# Patient Record
Sex: Male | Born: 1995 | Race: Black or African American | Hispanic: No | Marital: Single | Smoking: Never smoker
Health system: Southern US, Community
[De-identification: ages and names within clinical notes are randomized; demographics above are authoritative.]

## PROBLEM LIST (undated history)

## (undated) HISTORY — PX: TONSILLECTOMY: SUR1361

---

## 2015-11-15 ENCOUNTER — Encounter (HOSPITAL_COMMUNITY): Payer: Self-pay | Admitting: Emergency Medicine

## 2015-11-15 ENCOUNTER — Emergency Department (HOSPITAL_COMMUNITY): Payer: Managed Care, Other (non HMO)

## 2015-11-15 ENCOUNTER — Emergency Department (HOSPITAL_COMMUNITY)
Admission: EM | Admit: 2015-11-15 | Discharge: 2015-11-15 | Disposition: A | Discharge status: Home / Self Care | Payer: Managed Care, Other (non HMO) | Attending: Physician Assistant | Admitting: Physician Assistant

## 2015-11-15 DIAGNOSIS — R1013 Epigastric pain: Secondary | ICD-10-CM | POA: Diagnosis present

## 2015-11-15 DIAGNOSIS — Z880 Allergy status to penicillin: Secondary | ICD-10-CM | POA: Insufficient documentation

## 2015-11-15 DIAGNOSIS — N390 Urinary tract infection, site not specified: Secondary | ICD-10-CM | POA: Insufficient documentation

## 2015-11-15 DIAGNOSIS — R945 Abnormal results of liver function studies: Secondary | ICD-10-CM | POA: Insufficient documentation

## 2015-11-15 DIAGNOSIS — R748 Abnormal levels of other serum enzymes: Secondary | ICD-10-CM

## 2015-11-15 DIAGNOSIS — Z79899 Other long term (current) drug therapy: Secondary | ICD-10-CM | POA: Insufficient documentation

## 2015-11-15 LAB — URINALYSIS, ROUTINE W REFLEX MICROSCOPIC
BILIRUBIN URINE: NEGATIVE
GLUCOSE, UA: NEGATIVE mg/dL
KETONES UR: NEGATIVE mg/dL
Nitrite: NEGATIVE
PH: 7 (ref 5.0–8.0)
Protein, ur: NEGATIVE mg/dL
Specific Gravity, Urine: 1.009 (ref 1.005–1.030)

## 2015-11-15 LAB — CBC WITH DIFFERENTIAL/PLATELET
BASOS ABS: 0 10*3/uL (ref 0.0–0.1)
BASOS PCT: 0 %
Eosinophils Absolute: 0.1 10*3/uL (ref 0.0–0.7)
Eosinophils Relative: 0 %
HEMATOCRIT: 42.9 % (ref 39.0–52.0)
HEMOGLOBIN: 14.5 g/dL (ref 13.0–17.0)
LYMPHS PCT: 8 %
Lymphs Abs: 1.3 10*3/uL (ref 0.7–4.0)
MCH: 28.9 pg (ref 26.0–34.0)
MCHC: 33.8 g/dL (ref 30.0–36.0)
MCV: 85.5 fL (ref 78.0–100.0)
MONO ABS: 1.1 10*3/uL — AB (ref 0.1–1.0)
MONOS PCT: 7 %
NEUTROS ABS: 14.2 10*3/uL — AB (ref 1.7–7.7)
NEUTROS PCT: 85 %
Platelets: 259 10*3/uL (ref 150–400)
RBC: 5.02 MIL/uL (ref 4.22–5.81)
RDW: 13.3 % (ref 11.5–15.5)
WBC: 16.7 10*3/uL — ABNORMAL HIGH (ref 4.0–10.5)

## 2015-11-15 LAB — URINE MICROSCOPIC-ADD ON

## 2015-11-15 LAB — COMPREHENSIVE METABOLIC PANEL
ALBUMIN: 4.3 g/dL (ref 3.5–5.0)
ALK PHOS: 61 U/L (ref 38–126)
ALT: 217 U/L — ABNORMAL HIGH (ref 17–63)
AST: 206 U/L — AB (ref 15–41)
Anion gap: 9 (ref 5–15)
BILIRUBIN TOTAL: 1.5 mg/dL — AB (ref 0.3–1.2)
BUN: 12 mg/dL (ref 6–20)
CALCIUM: 10.1 mg/dL (ref 8.9–10.3)
CO2: 26 mmol/L (ref 22–32)
CREATININE: 0.95 mg/dL (ref 0.61–1.24)
Chloride: 102 mmol/L (ref 101–111)
GFR calc Af Amer: >60 mL/min (ref >60)
GLUCOSE: 97 mg/dL (ref 65–99)
Potassium: 4.3 mmol/L (ref 3.5–5.1)
Sodium: 137 mmol/L (ref 135–145)
TOTAL PROTEIN: 7.9 g/dL (ref 6.5–8.1)

## 2015-11-15 LAB — LIPASE, BLOOD: LIPASE: 24 U/L (ref 11–51)

## 2015-11-15 MED ORDER — SODIUM CHLORIDE 0.9 % IV BOLUS (SEPSIS)
500.0000 mL | Freq: Once | INTRAVENOUS | Status: AC
  Administered 2015-11-15: 500 mL via INTRAVENOUS

## 2015-11-15 MED ORDER — GI COCKTAIL ~~LOC~~
30.0000 mL | Freq: Once | ORAL | Status: AC
  Administered 2015-11-15: 30 mL via ORAL
  Filled 2015-11-15: qty 30

## 2015-11-15 MED ORDER — CEPHALEXIN 500 MG PO CAPS: 500.0000 mg | ORAL_CAPSULE | Freq: Two times a day (BID) | ORAL | Status: AC

## 2015-11-15 MED ORDER — NITROFURANTOIN MONOHYD MACRO 100 MG PO CAPS: 100.0000 mg | ORAL_CAPSULE | Freq: Two times a day (BID) | ORAL | Status: DC

## 2015-11-15 NOTE — ED Notes (Signed)
He is awake, alert and in no distress.  He has a male visitor with him.

## 2015-11-15 NOTE — ED Provider Notes (Signed)
CSN: [id number]     Arrival date & time 11/15/15  0453 History   First MD Initiated Contact with Patient 11/15/15 [phone]     Chief Complaint  Patient presents with  . Abdominal Pain     (Consider location/radiation/quality/duration/timing/severity/associated sxs/prior Treatment) HPI   Patient is a 20 year old male with no pertinent past medical history who presents to the ED with complaint of epigastric pain, onset yesterday afternoon. Patient reports after eating pizza and wings yesterday at lunch she began having mild discomfort to his epigastric region. She notes the pain started to improve and then worsened around 8 PM. Patient reports having constant sharp/cramping pain to his epigastric region, denies radiation. He notes the pain is worse when laying supine and is mildly alleviated when sitting upright. Patient reports taking Tums and ibuprofen at home with mild intermittent relief. Denies fever, chills, cough, chest pain, shortness of breath, nausea, vomiting, diarrhea, urinary symptoms.   History reviewed. No pertinent past medical history. Past Surgical History  Procedure Laterality Date  . Tonsillectomy     No family history on file. Social History  Substance Use Topics  . Smoking status: Never Smoker   . Smokeless tobacco: None  . Alcohol Use: No    Review of Systems  Gastrointestinal: Positive for abdominal pain.  All other systems reviewed and are negative.     Allergies  Penicillins  Home Medications   Prior to Admission medications   Medication Sig Start Date End Date Taking? Authorizing Provider  cephALEXin (KEFLEX) 500 MG capsule Take 1 capsule (500 mg total) by mouth 2 (two) times daily. 11/15/15   Barrett HenleNicole Elizabeth Quavon Keisling, PA-C  Multiple Vitamin (MULTIVITAMIN WITH MINERALS) TABS tablet Take 1 tablet by mouth daily.   Yes Historical Provider, MD  vitamin E 400 UNIT capsule Take 400 Units by mouth daily.   Yes Historical Provider, MD   BP 134/80 mmHg  Pulse  71  Temp(Src) 98.2 F (36.8 C) (Oral)  Resp 15  Ht 6' (1.829 m)  Wt 79.379 kg  BMI 23.73 kg/m2  SpO2 100% Physical Exam  Constitutional: He is oriented to person, place, and time. He appears well-developed and well-nourished. No distress.  HENT:  Head: Normocephalic and atraumatic.  Mouth/Throat: Oropharynx is clear and moist. No oropharyngeal exudate.  Eyes: Conjunctivae and EOM are normal. Right eye exhibits no discharge. Left eye exhibits no discharge. No scleral icterus.  Neck: Normal range of motion. Neck supple.  Cardiovascular: Normal rate, regular rhythm, normal heart sounds and intact distal pulses.   Pulmonary/Chest: Effort normal and breath sounds normal. No respiratory distress. He has no wheezes. He has no rales. He exhibits no tenderness.  Abdominal: Soft. Bowel sounds are normal. He exhibits no distension and no mass. There is tenderness (mild tenderness in epigastric region). There is no rebound and no guarding.  Musculoskeletal: Normal range of motion. He exhibits no edema.  Neurological: He is alert and oriented to person, place, and time.  Skin: Skin is warm and dry. He is not diaphoretic.  Nursing note and vitals reviewed.   ED Course  Procedures (including critical care time) Labs Review Labs Reviewed  CBC WITH DIFFERENTIAL/PLATELET - Abnormal; Notable for the following:    WBC 16.7 (*)    Neutro Abs 14.2 (*)    Monocytes Absolute 1.1 (*)    All other components within normal limits  COMPREHENSIVE METABOLIC PANEL - Abnormal; Notable for the following:    AST 206 (*)    ALT 217 (*)  Total Bilirubin 1.5 (*)    All other components within normal limits  URINALYSIS, ROUTINE W REFLEX MICROSCOPIC (NOT AT [facility]) - Abnormal; Notable for the following:    APPearance CLOUDY (*)    Hgb urine dipstick TRACE (*)    Leukocytes, UA SMALL (*)    All other components within normal limits  URINE MICROSCOPIC-ADD ON - Abnormal; Notable for the following:    Squamous  Epithelial / LPF 0-5 (*)    Bacteria, UA FEW (*)    All other components within normal limits  URINE CULTURE  LIPASE, BLOOD    Imaging Review US Abdomen Complete  11/15/2015  CLINICAL DATA:  Elevated liver enzymes with epigastric region pain EXAM: ABDOMEN ULTRASOUND COMPLETE COMPARISON:  None. FINDINGS: Gallbladder: No gallstones or wall thickening visualized. There is no pericholecystic fluid. No sonographic Murphy sign noted by sonographer. Common bile duct: Diameter: 3 mm. There is no intrahepatic, common hepatic, common bile duct dilatation. Liver: No focal lesion identified. Within normal limits in parenchymal echogenicity. IVC: No abnormality visualized. Pancreas: No mass or inflammatory focus. Spleen: Size and appearance within normal limits. Right Kidney: Length: 10.4 cm. Echogenicity slightly increased. Renal cortical thickness normal. No mass or hydronephrosis visualized. There is trace pericholecystic fluid in the lower pole region. Left Kidney: Length: 11.1 cm. Echogenicity within normal limits. No mass or hydronephrosis visualized. Abdominal aorta: No aneurysm visualized. Other findings: No demonstrable ascites. IMPRESSION: Right kidney mildly echogenic with trace pericholecystic fluid. No mass or evidence of renal abscess. Left kidney appears normal. Etiology for these changes on the right uncertain. These changes potentially could indicate early pyelonephritis. No abscess or focal right renal lesion apparent, however. Study otherwise unremarkable. Electronically Signed   By: Bretta Bang III M.D.   On: 11/15/2015 09:01   I have personally reviewed and evaluated these images and lab results as part of my medical decision-making.   EKG Interpretation None      MDM   Final diagnoses:  UTI (lower urinary tract infection)  Elevated liver enzymes  Epigastric pain    Patient presented epigastric pain that started after eating pizza and wings yesterday. VSS. Exam revealed mild  tenderness in epigastric region, no peritoneal signs. Pt reports his pain has improved since arrival to the ED. UA consistent with UTI, urine culture sent. WBC 16.7. AST 206, ALT 217, Bili Tot 1.5. Remaining labs unremarkable. Abdominal US showed changes consistent with early pyelonephritis of right kidney, gallbladder, common bile duct and liver unremarkable. Discussed results with pt. Plan to d/c pt home with antibiotics for UTI and advised pt to follow up with GI regarding his elevated LFTs. Pt given resource to follow up with GI. Pt was also given strict return precautions.   Evaluation does not show pathology requring ongoing emergent intervention or admission. Pt is hemodynamically stable and mentating appropriately. Discussed findings/results and plan with patient/guardian, who agrees with plan. All questions answered. Return precautions discussed and outpatient follow up given.      Satira Sark [place], [place] 11/15/15 1138  Courteney Randall An, MD 11/18/15 1511

## 2015-11-15 NOTE — Discharge Instructions (Signed)
Take your medications as prescribed for your UTI. Follow up with the GI clinic listed above to schedule a follow up appointment regarding your elevated liver enzymes (AST 206, ALT 217, Tot Bili 1.5). Please return to the Emergency Department if symptoms worsen or new onset of fever, flank pain, abdominal pain, vomiting, pain/burning with urination, blood in urine, penile discharge.

## 2015-11-15 NOTE — ED Notes (Signed)
Pt c/o epigastric pain onset last evening @2000 . Pt first noticed discomfort after eating pizza and wings yesterday. Pain returned this morning. Denies nausea, diarrhea

## 2015-11-16 ENCOUNTER — Telehealth (HOSPITAL_BASED_OUTPATIENT_CLINIC_OR_DEPARTMENT_OTHER): Payer: Self-pay | Admitting: Emergency Medicine

## 2015-11-16 LAB — URINE CULTURE

## 2017-05-29 ENCOUNTER — Emergency Department (HOSPITAL_BASED_OUTPATIENT_CLINIC_OR_DEPARTMENT_OTHER)
Admission: EM | Admit: 2017-05-29 | Discharge: 2017-05-29 | Disposition: A | Discharge status: Home / Self Care | Payer: Managed Care, Other (non HMO) | Attending: Emergency Medicine | Admitting: Emergency Medicine

## 2017-05-29 ENCOUNTER — Encounter (HOSPITAL_BASED_OUTPATIENT_CLINIC_OR_DEPARTMENT_OTHER): Payer: Self-pay | Admitting: Emergency Medicine

## 2017-05-29 ENCOUNTER — Emergency Department (HOSPITAL_BASED_OUTPATIENT_CLINIC_OR_DEPARTMENT_OTHER): Payer: Managed Care, Other (non HMO)

## 2017-05-29 DIAGNOSIS — Y929 Unspecified place or not applicable: Secondary | ICD-10-CM | POA: Insufficient documentation

## 2017-05-29 DIAGNOSIS — Y9389 Activity, other specified: Secondary | ICD-10-CM | POA: Diagnosis not present

## 2017-05-29 DIAGNOSIS — S8991XA Unspecified injury of right lower leg, initial encounter: Secondary | ICD-10-CM | POA: Diagnosis present

## 2017-05-29 DIAGNOSIS — X501XXA Overexertion from prolonged static or awkward postures, initial encounter: Secondary | ICD-10-CM | POA: Insufficient documentation

## 2017-05-29 DIAGNOSIS — S83411A Sprain of medial collateral ligament of right knee, initial encounter: Secondary | ICD-10-CM | POA: Diagnosis not present

## 2017-05-29 DIAGNOSIS — Y999 Unspecified external cause status: Secondary | ICD-10-CM | POA: Insufficient documentation

## 2017-05-29 DIAGNOSIS — M238X9 Other internal derangements of unspecified knee: Secondary | ICD-10-CM

## 2017-05-29 MED ORDER — MELOXICAM 15 MG PO TABS: 15.0000 mg | ORAL_TABLET | Freq: Every day | ORAL | 0 refills | Status: AC

## 2017-05-29 NOTE — ED Notes (Signed)
Pt discharged to home with family. NAD.  

## 2017-05-29 NOTE — Discharge Instructions (Signed)
Follow up in the emergency department if you are unable to walk, have new numbness or tingling in your leg.

## 2017-05-29 NOTE — ED Provider Notes (Signed)
[facility] Provider [facility]   CSN: [id number] Arrival date & time: 05/29/17  1140     History   Chief Complaint Chief Complaint  Patient presents with  . Knee Pain    HPI Jaggar Benko is a 21 y.o. male.He presents emergency Department with chief complaint of right knee pain. Patient states that he was coughing yesterday and as he swung his foot stayed planted. He gets immediate severe and significant pain in the medial part of his knee. He was able to get up and ambulate however he had significant pain. He feels unstable on the medial side of his leg. He states he put a knee brace on and slept with it but every time he returned he had severe pain and this morning when he took that and he was unable to ambulate at all without it. He came to the emergency department seeking evaluation. He denies any swelling in the calf, numbness or tingling. He has no joint swelling in the knee. He has no previous injuries to the knee  HPI  History reviewed. No pertinent past medical history.  There are no active problems to display for this patient.   Past Surgical History:  Procedure Laterality Date  . TONSILLECTOMY         Home Medications    Prior to Admission medications   Medication Sig Start Date End Date Taking? Authorizing Provider  cephALEXin (KEFLEX) 500 MG capsule Take 1 capsule (500 mg total) by mouth 2 (two) times daily. 11/15/15   Barrett Henle, PA-C  Multiple Vitamin (MULTIVITAMIN WITH MINERALS) TABS tablet Take 1 tablet by mouth daily.    [provider]  vitamin E 400 UNIT capsule Take 400 Units by mouth daily.    [provider]    Family History History reviewed. No pertinent family history.  Social History Social History  Substance Use Topics  . Smoking status: Never Smoker  . Smokeless tobacco: Never Used  . Alcohol use No     Allergies   Penicillins   Review of Systems Review of Systems  Positive for joint pain  negative for joint swelling, negative for fevers or chills, negative for weakness Physical Exam Updated Vital Signs BP (!) 148/96 (BP Location: Right Arm)   Pulse 68   Temp 99 F (37.2 C) (Oral)   Resp 16   Ht  (1.854 m) Comment: Simultaneous filing. User may not have seen previous data.  Wt 89.8 kg (198 lb) Comment: Simultaneous filing. User may not have seen previous data.  SpO2 100%   BMI 26.12 kg/m   Physical Exam  Constitutional: He appears well-developed and well-nourished. No distress.  HENT:  Head: Normocephalic and atraumatic.  Eyes: Conjunctivae are normal. No scleral icterus.  Neck: Normal range of motion. Neck supple.  Cardiovascular: Normal rate, regular rhythm and normal heart sounds.   Pulmonary/Chest: Effort normal and breath sounds normal. No respiratory distress.  Abdominal: Soft. There is no tenderness.  Musculoskeletal: He exhibits no edema.  Patient with tenderness along the medial knee, no joint line tenderness, no effusion or swelling. Pain at the medial knee with flexion and extension, medial collateral ligament laxity upon distress.  Neurological: He is alert.  Skin: Skin is warm and dry. He is not diaphoretic.  Psychiatric: His behavior is normal.  Nursing note and vitals reviewed.    ED Treatments / Results  Labs (all labs ordered are listed, but only abnormal results are displayed) Labs Reviewed - No data to  display  EKG  EKG Interpretation None       Radiology Dg Knee Complete 4 Views Right  Result Date: 05/29/2017 CLINICAL DATA:  Acute right knee pain following twisting injury yesterday. Initial encounter. EXAM: RIGHT KNEE - COMPLETE 4+ VIEW COMPARISON:  None. FINDINGS: No evidence of fracture, dislocation, or joint effusion. No evidence of arthropathy or other focal bone abnormality. Soft tissues are unremarkable. IMPRESSION: Negative. Electronically Signed   By: Harmon Pier M.D.   On: 05/29/2017 12:35    Procedures Procedures  (including critical care time)  Medications Ordered in ED Medications - No data to display   Initial Impression / Assessment and Plan / ED Course  I have reviewed the triage vital signs and the nursing notes.  Pertinent labs & imaging results that were available during my care of the patient were reviewed by me and considered in my medical decision making (see chart for details).     Patient x-ray negative for acute fracture or dislocation. Patient given knee immobilizer and crutches.  Final Clinical Impressions(s) / ED Diagnoses   Final diagnoses:  Sprain of medial collateral ligament of right knee, initial encounter  Instability of MCL    New Prescriptions New Prescriptions   No medications on file     Arthor Captain, PA-C 05/29/17 1656    Arby Barrette, MD 06/03/17 [phone]

## 2017-05-29 NOTE — ED Triage Notes (Signed)
Patient states that he fell yesterday and hurt his right knee. He has been able to walk on it up until today when it hurts and it is stiff

## 2017-11-17 ENCOUNTER — Other Ambulatory Visit: Payer: Self-pay

## 2017-11-17 ENCOUNTER — Encounter (HOSPITAL_BASED_OUTPATIENT_CLINIC_OR_DEPARTMENT_OTHER): Payer: Self-pay | Admitting: *Deleted

## 2017-11-17 ENCOUNTER — Emergency Department (HOSPITAL_BASED_OUTPATIENT_CLINIC_OR_DEPARTMENT_OTHER)
Admission: EM | Admit: 2017-11-17 | Discharge: 2017-11-17 | Disposition: A | Discharge status: Home / Self Care | Payer: BLUE CROSS/BLUE SHIELD | Attending: Emergency Medicine | Admitting: Emergency Medicine

## 2017-11-17 DIAGNOSIS — K625 Hemorrhage of anus and rectum: Secondary | ICD-10-CM | POA: Diagnosis present

## 2017-11-17 DIAGNOSIS — Z79899 Other long term (current) drug therapy: Secondary | ICD-10-CM | POA: Diagnosis not present

## 2017-11-17 DIAGNOSIS — K648 Other hemorrhoids: Secondary | ICD-10-CM | POA: Diagnosis not present

## 2017-11-17 LAB — OCCULT BLOOD X 1 CARD TO LAB, STOOL: Fecal Occult Bld: POSITIVE — AB

## 2017-11-17 MED ORDER — PHENYLEPHRINE IN HARD FAT 0.25 % RE SUPP: 1.0000 | Freq: Two times a day (BID) | RECTAL | 0 refills | Status: AC

## 2017-11-17 NOTE — ED Provider Notes (Signed)
[facility] EMERGENCY DEPARTMENT Provider Note   CSN: [id number] Arrival date & time: 11/17/17  1934     History   Chief Complaint Chief Complaint  Patient presents with  . Rectal Bleeding    HPI Jacob Reeves is a 22 y.o. male without significant past medical history, presenting to the ED with acute onset of bright red blood in his stool x2 days.  Patient states he has no other associated symptoms.  States his stool was streaked with blood. No medications tried. Denies rectal pain.  Denies constipation, however reports mild straining.  No history of hemorrhoids.  No urinary symptoms, no pain with defecation, no abdominal complaints, no genital complaints.  The history is provided by the patient.    History reviewed. No pertinent past medical history.  There are no active problems to display for this patient.   Past Surgical History:  Procedure Laterality Date  . TONSILLECTOMY         Home Medications    Prior to Admission medications   Medication Sig Start Date End Date Taking? Authorizing Provider  Multiple Vitamin (MULTIVITAMIN WITH MINERALS) TABS tablet Take 1 tablet by mouth daily.   Yes [provider]  cephALEXin (KEFLEX) 500 MG capsule Take 1 capsule (500 mg total) by mouth 2 (two) times daily. 11/15/15   Barrett Henle, PA-C  meloxicam (MOBIC) 15 MG tablet Take 1 tablet (15 mg total) by mouth daily. 05/29/17   Harris, Abigail, PA-C  phenylephrine (,USE FOR PREPARATION-H,) 0.25 % suppository Place 1 suppository rectally 2 (two) times daily. 11/17/17   Socrates Cahoon, [place] N, PA-C  vitamin E 400 UNIT capsule Take 400 Units by mouth daily.    [provider]    Family History No family history on file.  Social History Social History   Tobacco Use  . Smoking status: Never Smoker  . Smokeless tobacco: Never Used  Substance Use Topics  . Alcohol use: No  . Drug use: No     Allergies   Penicillins   Review of  Systems Review of Systems  Gastrointestinal: Positive for blood in stool. Negative for abdominal pain, diarrhea and rectal pain.  All other systems reviewed and are negative.    Physical Exam Updated Vital Signs BP (!) 141/85 (BP Location: Right Arm)   Pulse 70   Temp 98.4 F (36.9 C) (Oral)   Resp 18   Ht 6\' 1"  (1.854 m)   Wt 97.5 kg (215 lb)   SpO2 100%   BMI 28.37 kg/m   Physical Exam  Constitutional: He appears well-developed and well-nourished.  HENT:  Head: Normocephalic and atraumatic.  Eyes: Conjunctivae are normal.  Cardiovascular: Normal rate and intact distal pulses.  Pulmonary/Chest: Effort normal.  Abdominal: Soft. Bowel sounds are normal. He exhibits no distension. There is no tenderness. There is no rebound and no guarding.  Genitourinary:  Genitourinary Comments: Exam performed with male chaperone present.  Rectal exam with small internal hemorrhoid palpated, with associated tenderness.  Some gross blood noted.  No external hemorrhoids.  No tenderness with deep palpation.  Neurological: He is alert.  Skin: Skin is warm.  Psychiatric: He has a normal mood and affect. His behavior is normal.  Nursing note and vitals reviewed.    ED Treatments / Results  Labs (all labs ordered are listed, but only abnormal results are displayed) Labs Reviewed  OCCULT BLOOD X 1 CARD TO LAB, STOOL - Abnormal; Notable for the following components:      Result  Value   Fecal Occult Bld POSITIVE (*)    All other components within normal limits  POC OCCULT BLOOD, ED    EKG  EKG Interpretation None       Radiology No results found.  Procedures Procedures (including critical care time)  Medications Ordered in ED Medications - No data to display   Initial Impression / Assessment and Plan / ED Course  I have reviewed the triage vital signs and the nursing notes.  Pertinent labs & imaging results that were available during my care of the patient were reviewed by me  and considered in my medical decision making (see chart for details).    Patient presenting with bright red blood in his stool for 2 days, with associated mild straining.  No abdominal complaints is no pain with defecation, no urinary symptoms.  On exam, patient is well-appearing, and abdomen is soft and nontender.  Rectal exam with small tender internal hemorrhoid and small amount of gross blood.  Hemoccult positive.  Will discharge with symptomatic management, encouraged p.o. hydration and fiber intake, PCP follow-up as needed.  Safe for discharge.  Discussed results, findings, treatment and follow up. Patient advised of return precautions. Patient verbalized understanding and agreed with plan.   Final Clinical Impressions(s) / ED Diagnoses   Final diagnoses:  Bleeding internal hemorrhoids    ED Discharge Orders        Ordered    phenylephrine (,USE FOR PREPARATION-H,) 0.25 % suppository  2 times daily     11/17/17 2137       Gunnar Hereford, [place] N, PA-C 11/18/17 0018    Tegeler, Canary Brimhristopher J, MD 11/18/17 [phone]

## 2017-11-17 NOTE — Discharge Instructions (Signed)
You can use the suppository, 2 times per day, as needed for discomfort and bleeding.  It is important to drink plenty of water, and eat a high-fiber diet to prevent constipation. Follow-up with your primary care provider if symptoms persist. Return to the ER for significant amount of rectal bleeding, or new or concerning symptoms.

## 2017-11-17 NOTE — ED Notes (Signed)
Family at bedside. 

## 2017-11-17 NOTE — ED Triage Notes (Signed)
Bright red rectal bleeding x 2 days with bowel movements. He has been constipated. No pain.

## 2018-06-04 IMAGING — DX DG KNEE COMPLETE 4+V*R*
4 series · 4 of 4 positions shown · non-contrast
Comparison: None.

CLINICAL DATA: Acute right knee pain following twisting injury
yesterday. Initial encounter.

EXAM:
RIGHT KNEE - COMPLETE 4+ VIEW

[knee ap]
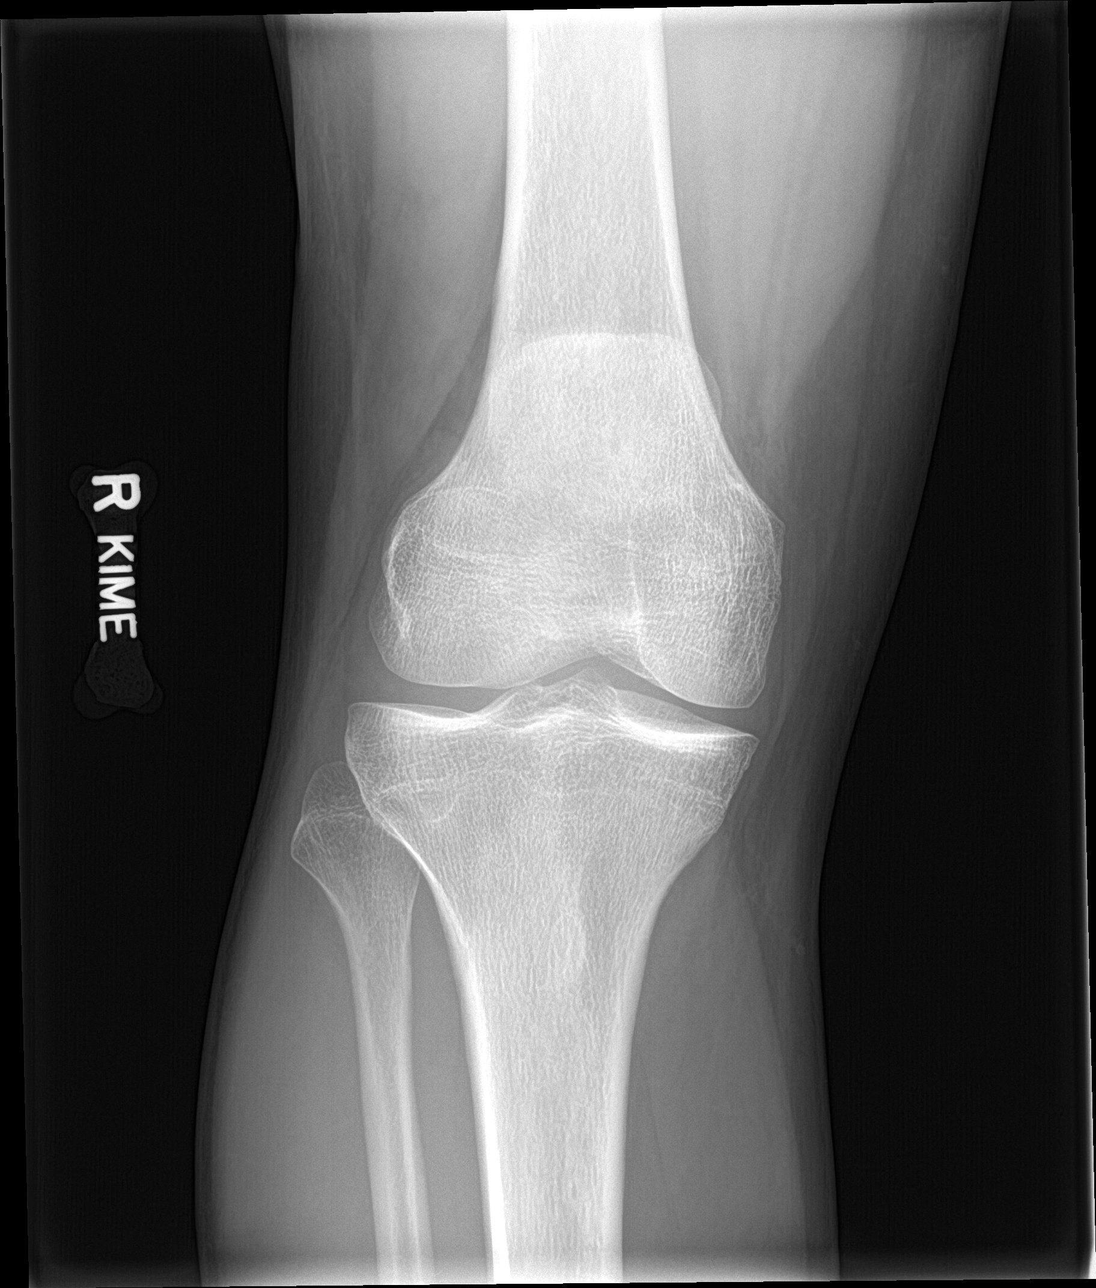

[knee lat]
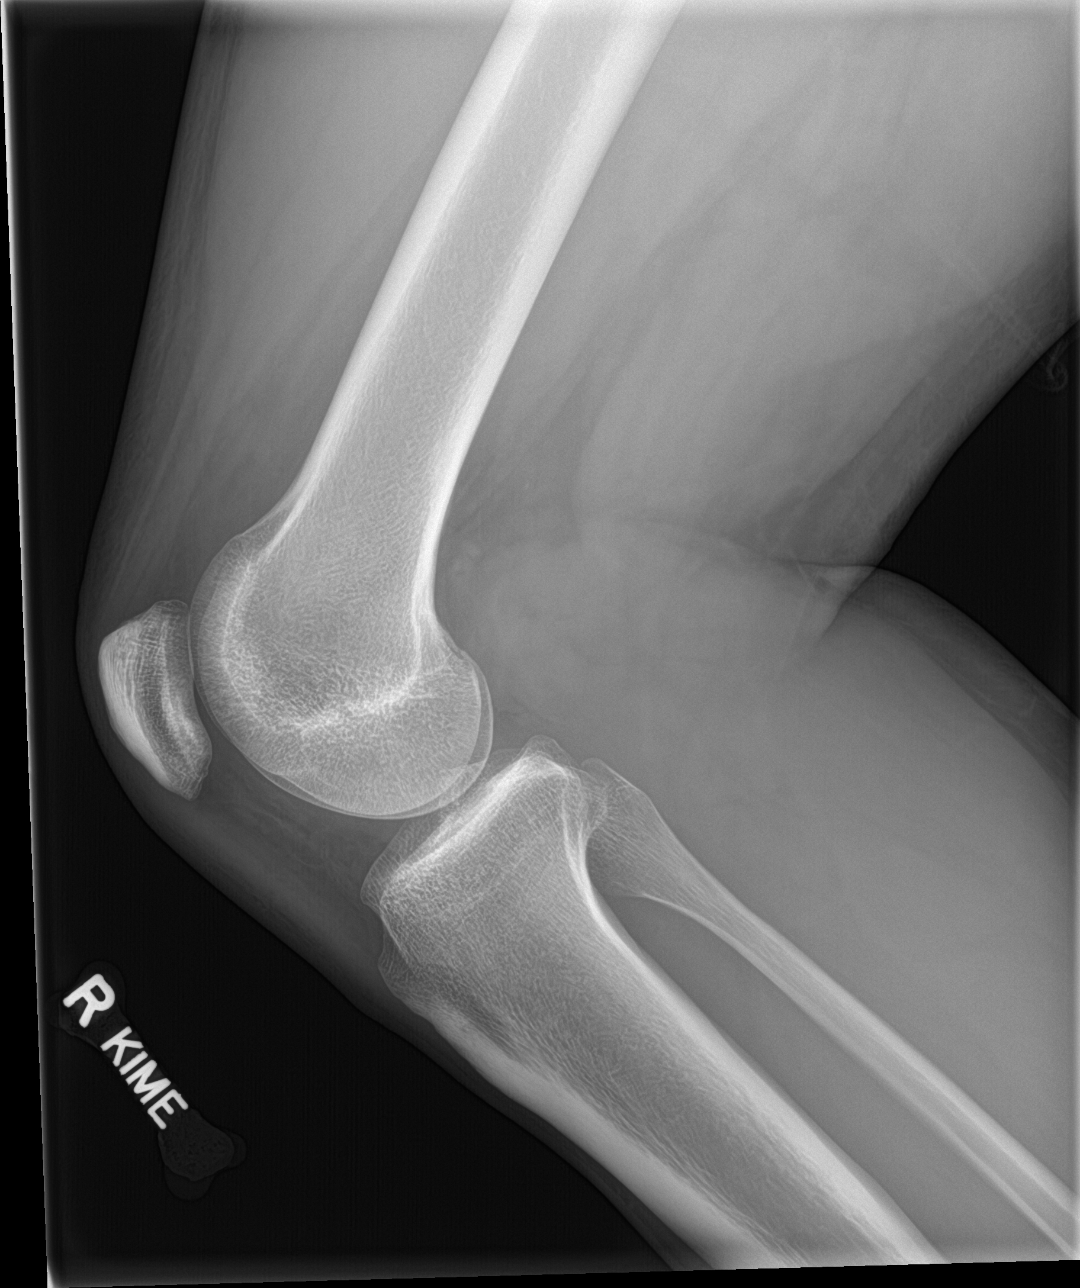

[knee obl (1 of 2)]
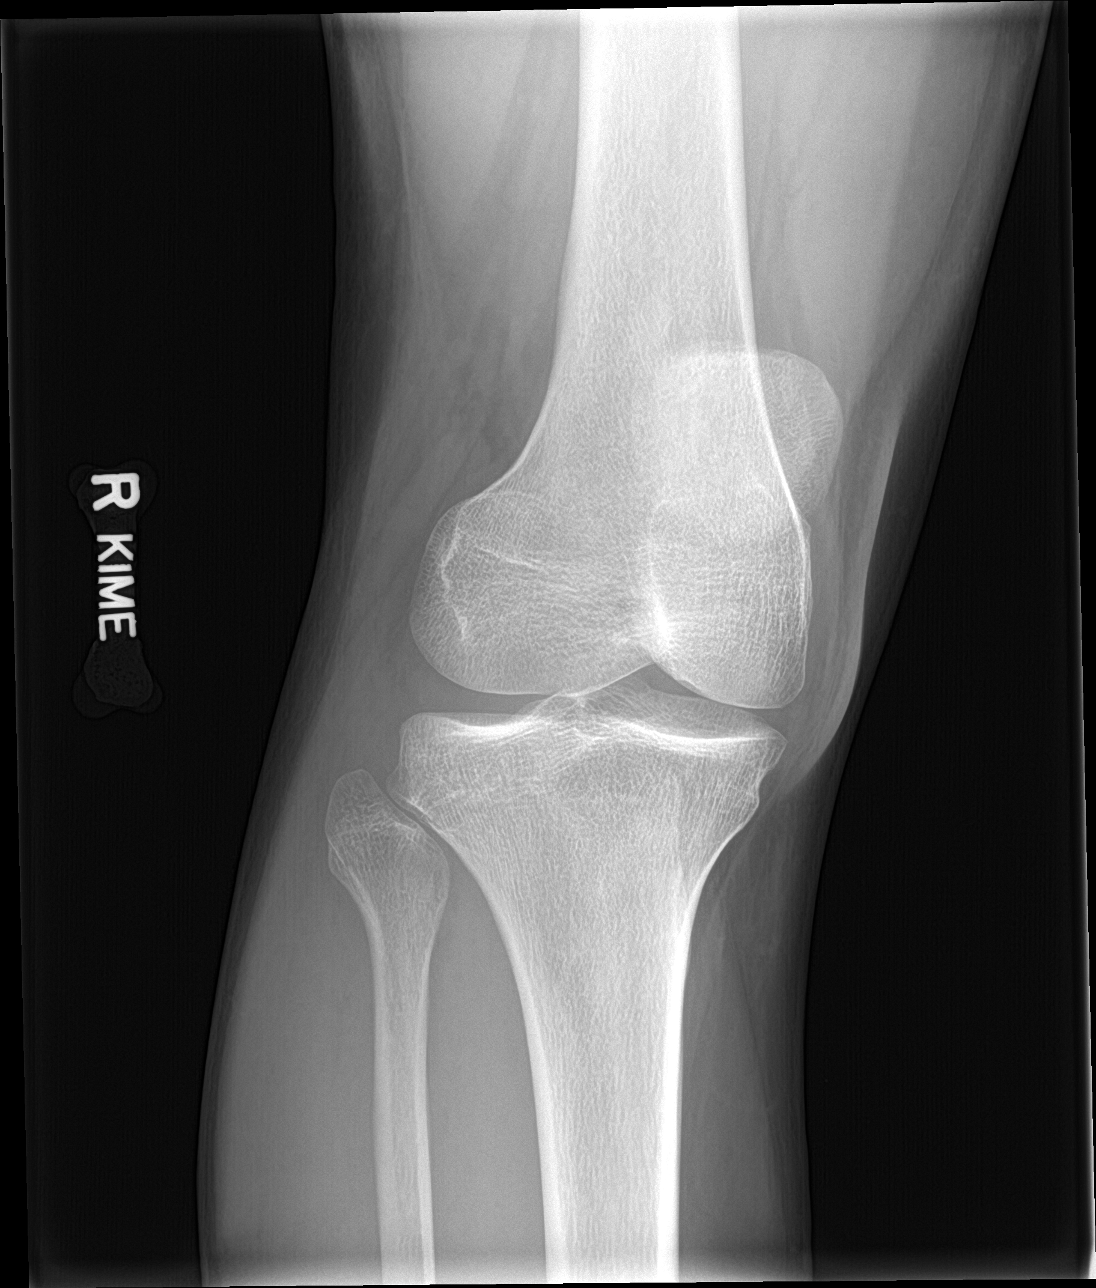

[knee obl (2 of 2)]
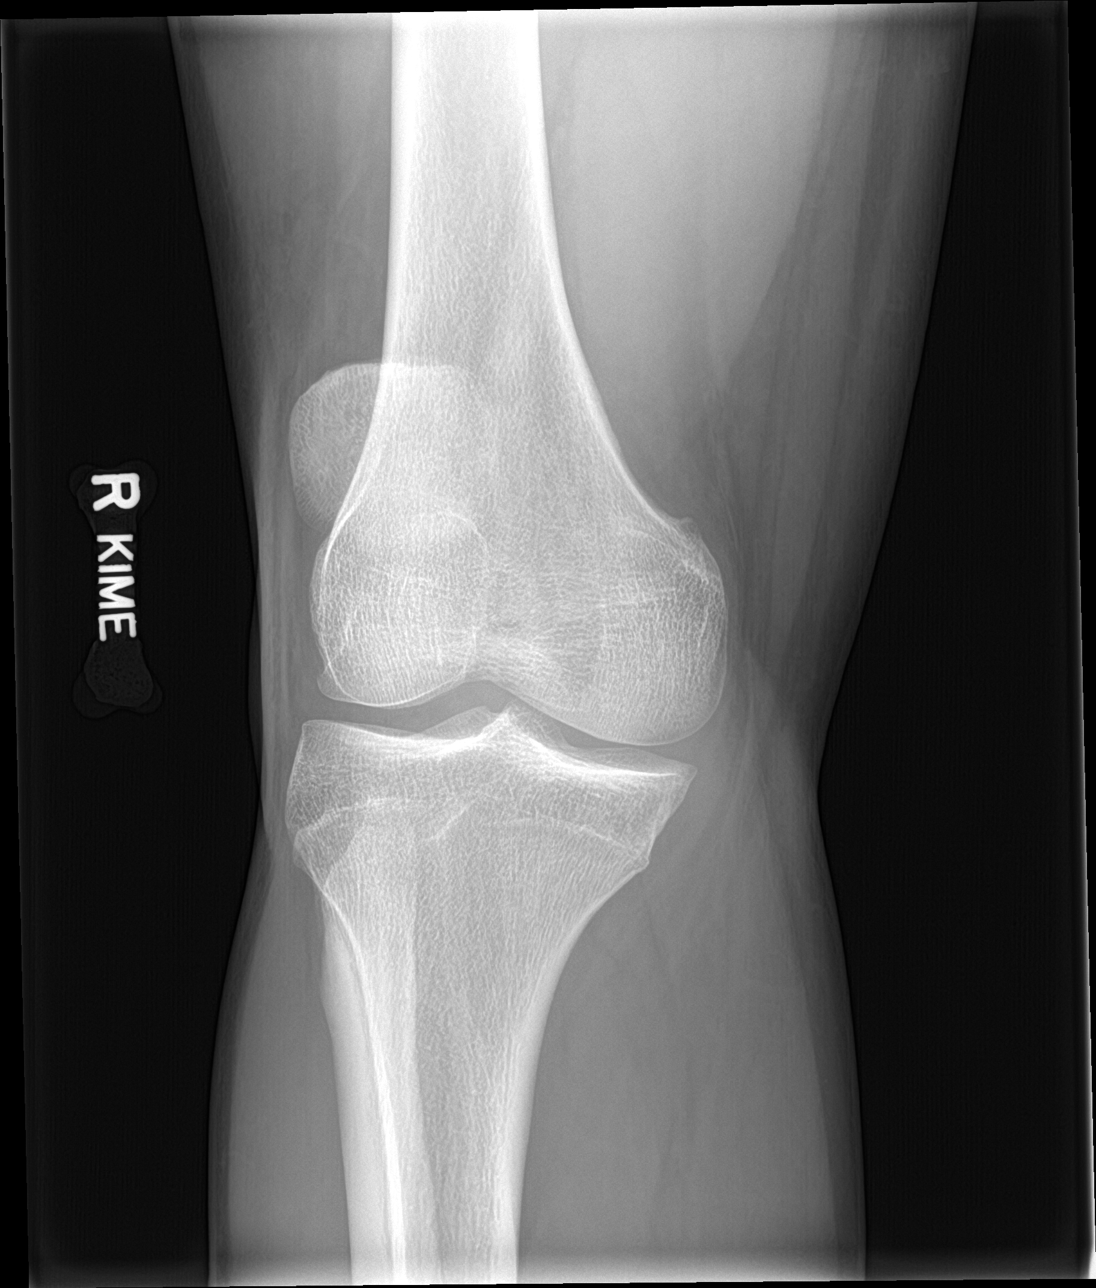

[4 of 4 positions shown; findings below may reference images not displayed]

FINDINGS: No evidence of fracture, dislocation, or joint effusion. No evidence
of arthropathy or other focal bone abnormality. Soft tissues are
unremarkable.
IMPRESSION: Negative.
